# Patient Record
Sex: Male | Born: 2005 | Race: White | Hispanic: No | Marital: Single | Smoking: Never smoker
Health system: Southern US, Community
[De-identification: ages and names within clinical notes are randomized; demographics above are authoritative.]

## PROBLEM LIST (undated history)

## (undated) HISTORY — PX: TYMPANOSTOMY TUBE PLACEMENT: SHX32

---
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
## 2024-08-31 ENCOUNTER — Encounter (HOSPITAL_COMMUNITY): Payer: Self-pay | Admitting: General Surgery

## 2024-08-31 ENCOUNTER — Other Ambulatory Visit: Payer: Self-pay

## 2024-08-31 NOTE — Progress Notes (Signed)
 PCP - Nena Cyndee LABOR, PA-C  Cardiologist -   PPM/ICD - denies Device Orders - n/a Rep Notified - n/a  Chest x-ray - denies EKG - denies Stress Test - denies ECHO - denies Cardiac Cath - denies  CPAP - denies  DM -denies  Blood Thinner Instructions: denies Aspirin Instructions: denies  ERAS Protcol - clear liquids until 7:30  COVID TEST- n/a  Anesthesia review: no  Patient verbally denies any shortness of breath, fever, cough and chest pain during phone call   -------------  SDW INSTRUCTIONS given:  Your procedure is scheduled on September 02, 2024.  Report to [facility] Main Entrance A at 8:00 A.M., and check in at the Admitting office.  Call this number if you have problems the morning of surgery:  [phone]   Remember:  Do not eat after midnight the night before your surgery  You may drink clear liquids until 7:30 the morning of your surgery.   Clear liquids allowed are: Water, Non-Citrus Juices (without pulp), Carbonated Beverages, Clear Tea, Black Coffee Only, and Gatorade    Take these medicines the morning of surgery with A SIP OF WATER  levocetirizine (XYZAL)   As of today, STOP taking any Aspirin (unless otherwise instructed by your surgeon) Aleve, Naproxen, Ibuprofen, Motrin, Advil, Goody's, BC's, all herbal medications, fish oil, and all vitamins.                      Do not wear jewelry, make up, or nail polish            Do not wear lotions, powders, perfumes/colognes, or deodorant.            Do not shave 48 hours prior to surgery.  Men may shave face and neck.            Do not bring valuables to the hospital.            [facility] is not responsible for any belongings or valuables.  Do NOT Smoke (Tobacco/Vaping) 24 hours prior to your procedure If you use a CPAP at night, you may bring all equipment for your overnight stay.   Contacts, glasses, dentures or bridgework may not be worn into surgery.      For patients admitted to the  hospital, discharge time will be determined by your treatment team.   Patients discharged the day of surgery will not be allowed to drive home, and someone needs to stay with them for 24 hours.    Special instructions:   [place]- Preparing For Surgery  Before surgery, you can play an important role. Because skin is not sterile, your skin needs to be as free of germs as possible. You can reduce the number of germs on your skin by washing with CHG (chlorahexidine gluconate) Soap before surgery.  CHG is an antiseptic cleaner which kills germs and bonds with the skin to continue killing germs even after washing.    Oral Hygiene is also important to reduce your risk of infection.  Remember - BRUSH YOUR TEETH THE MORNING OF SURGERY WITH YOUR REGULAR TOOTHPASTE  Please do not use if you have an allergy to CHG or antibacterial soaps. If your skin becomes reddened/irritated stop using the CHG.  Do not shave (including legs and underarms) for at least 48 hours prior to first CHG shower. It is OK to shave your face.  Please follow these instructions carefully.   Shower the OMNICOM SURGERY and the  MORNING OF SURGERY with DIAL Soap.   Pat yourself dry with a CLEAN TOWEL.  Wear CLEAN PAJAMAS to bed the night before surgery  Place CLEAN SHEETS on your bed the night of your first shower and DO NOT SLEEP WITH PETS.   Day of Surgery: Please shower morning of surgery  Wear Clean/Comfortable clothing the morning of surgery Do not apply any deodorants/lotions.   Remember to brush your teeth WITH YOUR REGULAR TOOTHPASTE.   Questions were answered. Patient verbalized understanding of instructions.

## 2024-09-02 ENCOUNTER — Ambulatory Visit (HOSPITAL_COMMUNITY): Admitting: Certified Registered"

## 2024-09-02 ENCOUNTER — Encounter (HOSPITAL_COMMUNITY): Admission: RE | Disposition: A | Payer: Self-pay | Source: Home / Self Care | Attending: General Surgery

## 2024-09-02 ENCOUNTER — Other Ambulatory Visit: Payer: Self-pay

## 2024-09-02 ENCOUNTER — Ambulatory Visit (HOSPITAL_COMMUNITY)
Admission: RE | Admit: 2024-09-02 | Discharge: 2024-09-02 | Disposition: A | Discharge status: Home / Self Care | Attending: General Surgery | Admitting: General Surgery

## 2024-09-02 ENCOUNTER — Encounter (HOSPITAL_COMMUNITY): Payer: Self-pay | Admitting: General Surgery

## 2024-09-02 DIAGNOSIS — L0591 Pilonidal cyst without abscess: Secondary | ICD-10-CM | POA: Insufficient documentation

## 2024-09-02 HISTORY — PX: PILONIDAL CYST DRAINAGE: SHX743

## 2024-09-02 LAB — CBC
HCT: 44.8 % (ref 39.0–52.0)
Hemoglobin: 15.8 g/dL (ref 13.0–17.0)
MCH: 32.4 pg (ref 26.0–34.0)
MCHC: 35.3 g/dL (ref 30.0–36.0)
MCV: 92 fL (ref 80.0–100.0)
Platelets: 225 K/uL (ref 150–400)
RBC: 4.87 MIL/uL (ref 4.22–5.81)
RDW: 11.7 % (ref 11.5–15.5)
WBC: 3.2 K/uL — ABNORMAL LOW (ref 4.0–10.5)
nRBC: 0 % (ref 0.0–0.2)

## 2024-09-02 SURGERY — EXCISION, PILONIDAL CYST
Anesthesia: General | Site: Back

## 2024-09-02 MED ORDER — MIDAZOLAM HCL 2 MG/2ML IJ SOLN
INTRAMUSCULAR | Status: AC
Start: 2024-09-02 — End: 2024-09-02
  Filled 2024-09-02: qty 2

## 2024-09-02 MED ORDER — OXYCODONE HCL 5 MG PO TABS: 5.0000 mg | ORAL_TABLET | Freq: Once | ORAL | Status: DC | PRN

## 2024-09-02 MED ORDER — SUGAMMADEX SODIUM 200 MG/2ML IV SOLN
INTRAVENOUS | Status: DC | PRN
  Administered 2024-09-02: 170 mg via INTRAVENOUS

## 2024-09-02 MED ORDER — OXYCODONE HCL 5 MG PO TABS: 5.0000 mg | ORAL_TABLET | Freq: Three times a day (TID) | ORAL | 0 refills | Status: AC | PRN

## 2024-09-02 MED ORDER — CHLORHEXIDINE GLUCONATE 0.12 % MT SOLN
OROMUCOSAL | Status: AC
  Administered 2024-09-02: 15 mL via OROMUCOSAL
  Filled 2024-09-02: qty 15

## 2024-09-02 MED ORDER — ACETAMINOPHEN 10 MG/ML IV SOLN: 1000.0000 mg | Freq: Once | INTRAVENOUS | Status: DC | PRN

## 2024-09-02 MED ORDER — CHLORHEXIDINE GLUCONATE 0.12 % MT SOLN: 15.0000 mL | Freq: Once | OROMUCOSAL | Status: AC

## 2024-09-02 MED ORDER — ONDANSETRON HCL 4 MG/2ML IJ SOLN
INTRAMUSCULAR | Status: DC | PRN
  Administered 2024-09-02: 4 mg via INTRAVENOUS

## 2024-09-02 MED ORDER — LACTATED RINGERS IV SOLN: INTRAVENOUS | Status: DC

## 2024-09-02 MED ORDER — ACETAMINOPHEN 325 MG PO TABS: 650.0000 mg | ORAL_TABLET | Freq: Four times a day (QID) | ORAL | 0 refills | Status: AC

## 2024-09-02 MED ORDER — FENTANYL CITRATE (PF) 100 MCG/2ML IJ SOLN
INTRAMUSCULAR | Status: AC
  Filled 2024-09-02: qty 2

## 2024-09-02 MED ORDER — MIDAZOLAM HCL (PF) 2 MG/2ML IJ SOLN
INTRAMUSCULAR | Status: DC | PRN
  Administered 2024-09-02: 2 mg via INTRAVENOUS

## 2024-09-02 MED ORDER — BUPIVACAINE-EPINEPHRINE (PF) 0.25% -1:200000 IJ SOLN
INTRAMUSCULAR | Status: AC
  Filled 2024-09-02: qty 30

## 2024-09-02 MED ORDER — LIDOCAINE 2% (20 MG/ML) 5 ML SYRINGE
INTRAMUSCULAR | Status: DC | PRN
  Administered 2024-09-02: 60 mg via INTRAVENOUS

## 2024-09-02 MED ORDER — ORAL CARE MOUTH RINSE
15.0000 mL | Freq: Once | OROMUCOSAL | Status: AC
Start: 2024-09-02 — End: 2024-09-02

## 2024-09-02 MED ORDER — LIDOCAINE 2% (20 MG/ML) 5 ML SYRINGE
INTRAMUSCULAR | Status: AC
Start: 2024-09-02 — End: 2024-09-02
  Filled 2024-09-02: qty 5

## 2024-09-02 MED ORDER — PROPOFOL 10 MG/ML IV BOLUS
INTRAVENOUS | Status: DC | PRN
  Administered 2024-09-02: 160 mg via INTRAVENOUS

## 2024-09-02 MED ORDER — FENTANYL CITRATE (PF) 250 MCG/5ML IJ SOLN
INTRAMUSCULAR | Status: DC | PRN
  Administered 2024-09-02: 100 ug via INTRAVENOUS

## 2024-09-02 MED ORDER — DEXAMETHASONE SOD PHOSPHATE PF 10 MG/ML IJ SOLN
INTRAMUSCULAR | Status: DC | PRN
  Administered 2024-09-02: 5 mg via INTRAVENOUS

## 2024-09-02 MED ORDER — HYDROGEN PEROXIDE 3 % EX SOLN
CUTANEOUS | Status: DC | PRN
  Administered 2024-09-02: 1

## 2024-09-02 MED ORDER — BUPIVACAINE-EPINEPHRINE (PF) 0.25% -1:200000 IJ SOLN
INTRAMUSCULAR | Status: DC | PRN
  Administered 2024-09-02: 20 mL

## 2024-09-02 MED ORDER — ONDANSETRON HCL 4 MG/2ML IJ SOLN
INTRAMUSCULAR | Status: AC
  Filled 2024-09-02: qty 2

## 2024-09-02 MED ORDER — IBUPROFEN 200 MG PO TABS: 600.0000 mg | ORAL_TABLET | Freq: Four times a day (QID) | ORAL | 0 refills | Status: AC

## 2024-09-02 MED ORDER — ROCURONIUM BROMIDE 10 MG/ML (PF) SYRINGE
PREFILLED_SYRINGE | INTRAVENOUS | Status: DC | PRN
  Administered 2024-09-02: 50 mg via INTRAVENOUS

## 2024-09-02 MED ORDER — 0.9 % SODIUM CHLORIDE (POUR BTL) OPTIME
TOPICAL | Status: DC | PRN
  Administered 2024-09-02: 1000 mL

## 2024-09-02 MED ORDER — FENTANYL CITRATE (PF) 100 MCG/2ML IJ SOLN: 25.0000 ug | INTRAMUSCULAR | Status: DC | PRN

## 2024-09-02 MED ORDER — OXYCODONE HCL 5 MG/5ML PO SOLN: 5.0000 mg | Freq: Once | ORAL | Status: DC | PRN

## 2024-09-02 MED ORDER — CEFAZOLIN SODIUM-DEXTROSE 2-3 GM-%(50ML) IV SOLR
INTRAVENOUS | Status: DC | PRN
Start: 2024-09-02 — End: 2024-09-02
  Administered 2024-09-02: 2 g via INTRAVENOUS

## 2024-09-02 MED ORDER — CEFAZOLIN SODIUM 1 G IJ SOLR
INTRAMUSCULAR | Status: AC
  Filled 2024-09-02: qty 20

## 2024-09-02 MED ORDER — PROPOFOL 10 MG/ML IV BOLUS
INTRAVENOUS | Status: AC
  Filled 2024-09-02: qty 20

## 2024-09-02 SURGICAL SUPPLY — 29 items
BAG COUNTER SPONGE SURGICOUNT (BAG) IMPLANT
BLADE CLIPPER SURG (BLADE) IMPLANT
CANISTER SUCTION 3000ML PPV (SUCTIONS) ×1 IMPLANT
COVER SURGICAL LIGHT HANDLE (MISCELLANEOUS) ×1 IMPLANT
DRAPE LAPAROTOMY T 102X78X121 (DRAPES) ×1 IMPLANT
ELECTRODE REM PT RTRN 9FT ADLT (ELECTROSURGICAL) ×1 IMPLANT
GAUZE PAD ABD 8X10 STRL (GAUZE/BANDAGES/DRESSINGS) IMPLANT
GAUZE SPONGE 4X4 12PLY STRL (GAUZE/BANDAGES/DRESSINGS) ×1 IMPLANT
GLOVE BIO SURGEON STRL SZ7 (GLOVE) ×1 IMPLANT
GOWN STRL REUS W/ TWL LRG LVL3 (GOWN DISPOSABLE) ×1 IMPLANT
GOWN STRL REUS W/ TWL XL LVL3 (GOWN DISPOSABLE) ×1 IMPLANT
HYDROGEN PEROXIDE 16OZ (MISCELLANEOUS) IMPLANT
KIT BASIN OR (CUSTOM PROCEDURE TRAY) ×1 IMPLANT
KIT TURNOVER KIT B (KITS) ×1 IMPLANT
NDL HYPO 25GX1X1/2 BEV (NEEDLE) IMPLANT
NEEDLE HYPO 25GX1X1/2 BEV (NEEDLE) ×1 IMPLANT
PACK GENERAL/GYN (CUSTOM PROCEDURE TRAY) ×1 IMPLANT
PAD ARMBOARD POSITIONER FOAM (MISCELLANEOUS) ×2 IMPLANT
PENCIL SMOKE EVACUATOR (MISCELLANEOUS) ×1 IMPLANT
POSITIONER HEAD DONUT 9IN (MISCELLANEOUS) IMPLANT
PUNCH BIOPSY DERMAL 6MM STRL (MISCELLANEOUS) IMPLANT
PUNCH BIOPSY DISP 4 (MISCELLANEOUS) IMPLANT
SOLN 0.9% NACL POUR BTL 1000ML (IV SOLUTION) ×1 IMPLANT
SYR CONTROL 10ML LL (SYRINGE) IMPLANT
SYRINGE TOOMEY IRRIG 70ML (MISCELLANEOUS) IMPLANT
TAPE CLOTH SURG 4X10 WHT LF (GAUZE/BANDAGES/DRESSINGS) IMPLANT
TOWEL GREEN STERILE (TOWEL DISPOSABLE) ×1 IMPLANT
TOWEL GREEN STERILE FF (TOWEL DISPOSABLE) ×1 IMPLANT
UNDERPAD 30X36 HEAVY ABSORB (UNDERPADS AND DIAPERS) ×1 IMPLANT

## 2024-09-02 NOTE — Anesthesia Preprocedure Evaluation (Signed)
 Anesthesia Evaluation  Patient identified by MRN, date of birth, ID band Patient awake    Reviewed: Allergy & Precautions, NPO status , Patient's Chart, lab work & pertinent test results  History of Anesthesia Complications Negative for: history of anesthetic complications  Airway Mallampati: II  TM Distance: >3 FB Neck ROM: Full    Dental  (+) Teeth Intact, Dental Advisory Given   Pulmonary neg shortness of breath, neg sleep apnea, neg COPD, neg recent URI   breath sounds clear to auscultation       Cardiovascular negative cardio ROS  Rhythm:Regular     Neuro/Psych negative neurological ROS  negative psych ROS   GI/Hepatic negative GI ROS, Neg liver ROS,,,  Endo/Other  negative endocrine ROS    Renal/GU negative Renal ROS     Musculoskeletal negative musculoskeletal ROS (+)    Abdominal   Peds  Hematology negative hematology ROS (+)   Anesthesia Other Findings   Reproductive/Obstetrics                              Anesthesia Physical Anesthesia Plan  ASA: 1  Anesthesia Plan: General   Post-op Pain Management: Ofirmev IV (intra-op)* and Toradol IV (intra-op)*   Induction: Intravenous  PONV Risk Score and Plan: 2 and Ondansetron  and Dexamethasone   Airway Management Planned: Oral ETT and LMA  Additional Equipment: None  Intra-op Plan:   Post-operative Plan: Extubation in OR  Informed Consent: I have reviewed the patients History and Physical, chart, labs and discussed the procedure including the risks, benefits and alternatives for the proposed anesthesia with the patient or authorized representative who has indicated his/her understanding and acceptance.     Dental advisory given  Plan Discussed with: CRNA  Anesthesia Plan Comments:         Anesthesia Quick Evaluation

## 2024-09-02 NOTE — H&P (Signed)
     Johnathan Wise [birth year]  [id number].    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 He reports that he is in his usual state of health and denies any recent changes in medication.   ROS: Review of Systems  Constitutional: Negative.   HENT: Negative.    Eyes: Negative.   Respiratory: Negative.    Cardiovascular: Negative.   Gastrointestinal: Negative.   Genitourinary: Negative.   Musculoskeletal: Negative.   Skin: Negative.   Neurological: Negative.   Endo/Heme/Allergies: Negative.   Psychiatric/Behavioral: Negative.      History reviewed. No pertinent family history.  History reviewed. No pertinent past medical history.  Past Surgical History:  Procedure Laterality Date   PERCUTANEOUS PINNING Left 01/24/2022   Procedure: CLOSED PERCUTANEOUS FIXATION- left ring finger ;  Surgeon: Elisabeth Craig RAMAN, MD;  Location: [facility] OR;  Service: Plastics;  Laterality: Left;   TYMPANOSTOMY TUBE PLACEMENT      Social History:  reports that he has never smoked. He does not have any smokeless tobacco history on file. He reports current alcohol use. He reports that he does not use drugs.  Allergies: No Known Allergies  Medications Prior to Admission  Medication Sig Dispense Refill   levocetirizine (XYZAL) 5 MG tablet Take 5 mg by mouth daily as needed for allergies.      Physical Exam: Height 6' 2 (1.88 m). Gen: male, NAD  No results found for this or any previous visit (from the past 48 hours). No results found.  Assessment/Plan 18 y/o M with pilonidal disease  - Will proceed to the OR. We discussed the alternatives and potential risks of surgery, including but not limited to: bleeding, infection, recurrence, wound complications, and need for additional procedures. All questions were addressed and consent was obtained.    Johnathan Wise Surgery 09/02/2024, 8:28 AM Please see Amion for pager number during  day hours 7:00am-4:30pm or 7:00am -11:30am on weekends

## 2024-09-02 NOTE — Transfer of Care (Signed)
 Immediate Anesthesia Transfer of Care Note  Patient: Johnathan Wise  Procedure(s) Performed: EXCISION, PILONIDAL CYST (Back)  Patient Location: PACU  Anesthesia Type:General  Level of Consciousness: awake, alert , and oriented  Airway & Oxygen Therapy: Patient Spontanous Breathing and Patient connected to face mask oxygen  Post-op Assessment: Report given to RN and Post -op Vital signs reviewed and stable  Post vital signs: Reviewed and stable  Last Vitals:  Vitals Value Taken Time  BP 122/77 09/02/24 10:50  Temp    Pulse 81 09/02/24 10:55  Resp 20 09/02/24 10:55  SpO2 98 % 09/02/24 10:55  Vitals shown include unfiled device data.  Last Pain:  Vitals:   09/02/24 0851  TempSrc:   PainSc: 0-No pain         Complications: No notable events documented.

## 2024-09-02 NOTE — Discharge Instructions (Signed)
 Outpatient Surgery For Pilonidal Disease  Activity  The effects of anesthesia are still present and drowsiness may result.  Limit activity for the first 24 hours, then you may return to normal daily activities. Returning to normal daily activities as soon as you can following surgery will enhance recovery time.  Do not drive or operate heavy machinery within 24 hours of taking narcotic pain medications.   Do not mow the lawn, use a vacuum cleaner, or do any other strenuous activities without first consulting your surgical team.   Diet Drink plenty of fluids and eat light meals today, then resume regular diet. Some patients may find their appetite is poor for a week or two after surgery. This is a normal result of the stress of surgery-your appetite will return in time.   There are no specific diet restrictions after surgery.   Dressing and Wound Care  Keep your wound or incision site clean and dry.  If there is any packing in your wound then this can be removed either the day after surgery or when it becomes dirty, whichever comes first. There is no need to repack the wound. You should keep a clean dressing over the wound to catch drainage and promote healing. This should be changed at least daily or when soiled. Some bleeding from your wounds is normal and will stop on its own. If the bleeding does not stop then place a piece of cotton or cloth over the wound and sit on it for 10-15 minutes. If it is still bleeding then call the office or seek medical attention.   What to Expect After Surgery   Moderate discomfort controlled with medications  Minimal drainage from incision  Feeling fatigue and weak  Constipation after surgery is common. Drink plenty fluids and eat a high fiber diet.  Pain Control: Sitz Baths If you have a bathtub then performing sitz baths 2-3x per day can help with pain and healing. Fill the tub with 2-3 inches of warm water and sit in the tub, making sure to submerge your  wounds. Do this for 10-15 minutes and then pat the wounds dry and re-apply your dressing.   Pain Control: Prescribed Non-Narcotic Pain Medication  You will be given three prescriptions.  Two of them will be for prescription strength ibuprofen  (i.e. Advil ) and prescription strength acetaminophen  (i.e. Tylenol ).  The vast majority of patients will just need these two medications.  One prescription will be for a 'rescue' prescription of an oral narcotic (oxycodone).  You may fill this if needed.  You will alternate taking the ibuprofen  (600mg ) every 6 hours and also the acetaminophen  (650mg ) every 6 hours so that you are taking one of those medications every 3 hours.  For example: o 0800 - take ibuprofen  600mg  o 1100 - take acetaminophen  650mg  o 1400 - take ibuprofen  600mg  o 1700 - take acetaminophen  650mg  o Etc.  Continue taking this alternating pattern of ibuprofen  and acetaminophen  for 3 days  If you cannot take one or the other of these medications, just take the one you can every 6 hours.  If you are comfortable at night, you don't have to wake up and take a medication.  If you are still uncomfortable after taking either ibuprofen  or acetaminophen , try gentle stretching exercise and ice packs (a bag of frozen vegetables works great).  If you are still uncomfortable, you may fill the narcotic prescription of Oxycodone and take as directed.  Once you have completed these prescriptions, your pain  level should be low enough to stop taking medications altogether or just use an over the counter medication (ibuprofen  or acetaminophen ) as needed.    Pain Control: Over the Counter Medications to take as needed  Colace/Docusate: May be prescribed by your surgeon to prevent constipation caused by the combination of narcotics, effects of anesthesia, and decreased ambulation.  Hold for loose stools or diarrhea. Take 100 mg 1-2 times a day starting tonight.   Fiber: High fiber foods, extra liquids (water  9-13 cups/day) can also assist with constipation. Examples of high fiber foods are fruit, bran. Prune juice and water are also good liquids to drink.  Milk of Magnesia/Miralax:  If constipated despite takeing the over the counter stool softeners, you may take Milk of Magnesia or Miralax as directed on bottle to assist with constipation.     Pepcid/Famotidine: May be prescribed while taking naproxen  (Aleve ) or other NSAIDs such as ibuprofen  (Motrin /Advil ) to prevent stomach upset or Acid-reflux symptoms. Take 1 tablet 1-2 times a day.   **Constipation: The first bowel movement may occur anywhere between 1-5 days after surgery.  As long as you are not nauseated or not having significant abdominal pain this variation is acceptable. Narcotic pain medications can cause constipation increasing discomfort; early discontinuation will assist with bowel management. If constipated despite taking stool softeners, you may take Milk of Magnesia or Miralax as directed on the bottle.     **Home medications: You may restart your home medications as directed by your respective Primary Care Physician or Surgeon.   When to notify your Doctor or Healthcare Team   Sign of Wound Infection   Fever over 100 degrees.  Wound becomes extremely swollen, shows red streaks, warm to the touch, and/or drainage from the incision site or foul-smelling drainage.  Wound edges separate or opens up  Bleeding or bruising   If you have bleeding, apply pressure to the site and hold the pressure firmly for 5 minutes. If the bleeding continues, apply pressure again and call 911. If the bleeding stopped, call your doctor to report it.   Call your doctor or nurse if you have increased bleeding from your site and increased bruising or a lump forms or gets larger under your skin at the site. Unrelieved Pain   Call your doctor or nurse if your pain gets worse or is not eased 1 hour after taking your pain medicine, or if it is severe and  uncontrolled. Nausea and Vomiting   Call your doctor or nurse if you have nausea and vomiting that continues more than 24 hours, will not let you keep medicine down and will not let you keep fluids down  Fever, Flu-like symptoms   Fever over 100 degrees and/or chills  Gastrointestinal Bleeding Symptoms    Black tarry bowel movements.  This can be normal after surgery on the stomach, but should resolve in a day or two.    Call 911 if you suddenly have signs of blood loss such as:  Vomiting blood  Fast heart rate  Feeling faint, sweaty, or blacking out  Passing bright red blood from your rectum  Blood Clot Symptoms   Tender, swollen or reddened areas in your calf muscle or thighs.  Numbness or tingling in your lower leg or calf, or at the top of your leg or groin  Skin on your leg looks pale or blue or feels cold to touch  Chest pain or have trouble breathing, lightheadedness, fast heart rate  Sudden Onset of  Symptoms    Call 911 if you suddenly have:  Leg weakness and spasm  Loss of bladder or bowel function  Seizure  Confusion, severe headache, dizziness or feeling unsteady, problems talking, difficulty swallowing, and/or numbness or muscle weakness as these could be signs of a stroke.  Follow up Appointment Your follow up appointment should be scheduled 2-3 weeks after your surgery date.  If you have not previously scheduled for a follow-up visit you can be scheduled by contacting [phone].

## 2024-09-02 NOTE — Op Note (Signed)
 09/02/2024  10:32 AM  PATIENT:  Johnathan Wise  18 y.o. male  Patient Care Team: Nena Cyndee DELENA DEVONNA as PCP - General (General Practice)  PRE-OPERATIVE DIAGNOSIS:  Pilonidal disease  POST-OPERATIVE DIAGNOSIS:  Same  PROCEDURE:  Trephination of pilonidal disease x 5  SURGEON:  Cordella RONAL Idler, MD  RESIDENT: Larnell Costain, MD   ANESTHESIA:   general  COUNTS:  Sponge, needle and instrument counts were reported correct x2 at the conclusion of the operation.  EBL: Minimal  DRAINS: None  SPECIMEN: None  COMPLICATIONS: None  FINDINGS: Large cavity tracking cephalad with significant hair/debris  DISPOSITION: PACU in satisfactory condition  INDICATION: Johnathan Wise is a 18 year old male with pilonidal disease who has had several flares. I offered him excision in the OR. All of his questions were addressed and written consent was obtained  DESCRIPTION: The patient was identified in preop holding and taken to the OR where he was placed on the operating room table. SCDs were placed. General endotracheal anesthesia was induced without difficulty. He was then placed prone and the gluteal area was then prepped and draped in the usual sterile fashion. A surgical timeout was performed indicating the correct patient, procedure, positioning and need for preoperative antibiotics.   We examined the gluteal cleft and identified 4 pits along the midline and an area with some induration cephalad near the sacrum. The pits were excised using a 4mm punch biopsy. We probed and found that each of the pits led down to a larger cavity that tracked cephalad to the area of induration near the sacrum. We used a curette to thorough clean the granulation tissue and debris from the pits and the underlying cavity. There was a significant amount of hair within the cavity that we removed. We made 1 more additional 4mm punch biopsy excision just above the cavity to allow for further drainage and debridement. Once  we were confident that we had removed all of the debris we irrigated with a 1:1 mix of hydrogen peroxide and sterile saline. The wounds were inspected for hemostasis. Kerlix was applied. The wounds were covered with an ABD.

## 2024-09-02 NOTE — Anesthesia Procedure Notes (Signed)
 Procedure Name: Intubation Date/Time: 09/02/2024 9:49 AM  Performed by: Vera Rochele PARAS, CRNAPre-anesthesia Checklist: Patient identified, Emergency Drugs available, Suction available and Patient being monitored Patient Re-evaluated:Patient Re-evaluated prior to induction Oxygen Delivery Method: Circle System Utilized Preoxygenation: Pre-oxygenation with 100% oxygen Induction Type: IV induction Ventilation: Mask ventilation without difficulty Laryngoscope Size: Miller and 3 Grade View: Grade I Tube type: Oral Tube size: 7.0 mm Number of attempts: 1 Airway Equipment and Method: Stylet and Oral airway Placement Confirmation: ETT inserted through vocal cords under direct vision, positive ETCO2 and breath sounds checked- equal and bilateral Secured at: 23 cm Tube secured with: Tape Dental Injury: Teeth and Oropharynx as per pre-operative assessment

## 2024-09-03 ENCOUNTER — Encounter (HOSPITAL_COMMUNITY): Payer: Self-pay | Admitting: General Surgery

## 2024-09-08 NOTE — Anesthesia Postprocedure Evaluation (Signed)
 Anesthesia Post Note  Patient: Nurse, [facility]  Procedure(s) Performed: EXCISION, PILONIDAL CYST (Back)     Patient location during evaluation: PACU Anesthesia Type: General Level of consciousness: awake and alert Pain management: pain level controlled Vital Signs Assessment: post-procedure vital signs reviewed and stable Respiratory status: spontaneous breathing, nonlabored ventilation and respiratory function stable Cardiovascular status: blood pressure returned to baseline and stable Postop Assessment: no apparent nausea or vomiting Anesthetic complications: no   No notable events documented.               Vandy Fong
